# Patient Record
Sex: Male | Born: 2000 | Race: White | Hispanic: No | Marital: Single | Smoking: Never smoker
Health system: Southern US, Community
[De-identification: ages and names within clinical notes are randomized; demographics above are authoritative.]

## PROBLEM LIST (undated history)

## (undated) DIAGNOSIS — F419 Anxiety disorder, unspecified: Secondary | ICD-10-CM

## (undated) DIAGNOSIS — F909 Attention-deficit hyperactivity disorder, unspecified type: Secondary | ICD-10-CM

---
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
## 2019-03-25 IMAGING — MR MR HEAD WO/W CM
12 series · 48 of 48 positions shown · IV contrast (multihance)
Comparison: MRI head 03/30/2016, 09/14/2015

CLINICAL DATA: Follow-up cerebrovascular malformation

EXAM:
MRI HEAD WITHOUT AND WITH CONTRAST
TECHNIQUE: Multiplanar, multiecho pulse sequences of the brain and surrounding
structures were obtained without and with intravenous contrast.
CONTRAST:  10mL MULTIHANCE GADOBENATE DIMEGLUMINE 529 MG/ML IV SOLN

[Series 5: T1 · sagittal · 4.0mm · 0.75mm/px · 2 of 31 slices shown (1 of 3)]
[im 1/31]
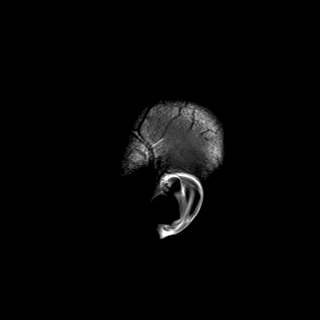
[im 31/31]
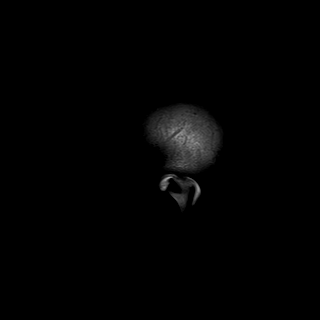

[Series 6: DWI · axial · 3.0mm · 1.44mm/px · z∈[-78,+68]mm · 5 of 92 slices shown (1 of 4)]
[im 1/92]
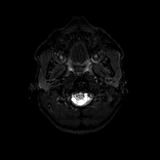
[im 23/92]
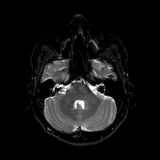
[im 46/92]
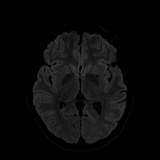
[im 69/92]
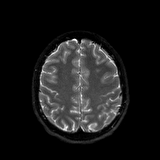
[im 92/92]
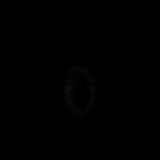

[Series 7: DWI · axial · 3.0mm · 1.44mm/px · z∈[-78,+68]mm · 3 of 43 slices shown (2 of 4)]
[im 1/43]
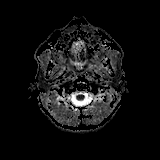
[im 22/43]
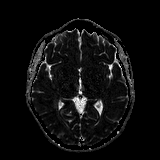
[im 43/43]
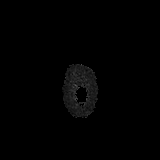

[Series 8: DWI · coronal · 5.0mm · 1.44mm/px · 4 of 64 slices shown (3 of 4)]
[im 1/64]
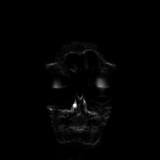
[im 22/64]
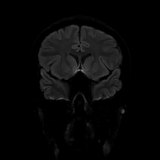
[im 43/64]
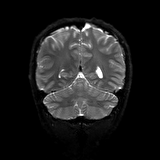
[im 64/64]
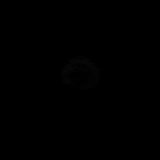

[Series 9: DWI · coronal · 5.0mm · 1.44mm/px · 2 of 33 slices shown (4 of 4)]
[im 1/33]
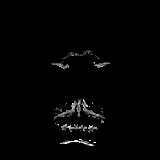
[im 33/33]
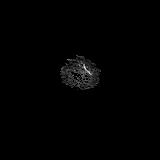

[Series 10: T2 · axial · 4.0mm · 0.36mm/px · z∈[-78,+70]mm · 2 of 30 slices shown]
[im 1/30]
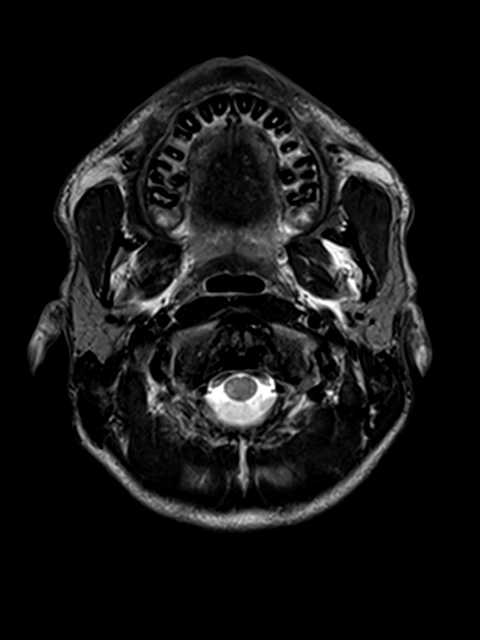
[im 30/30]
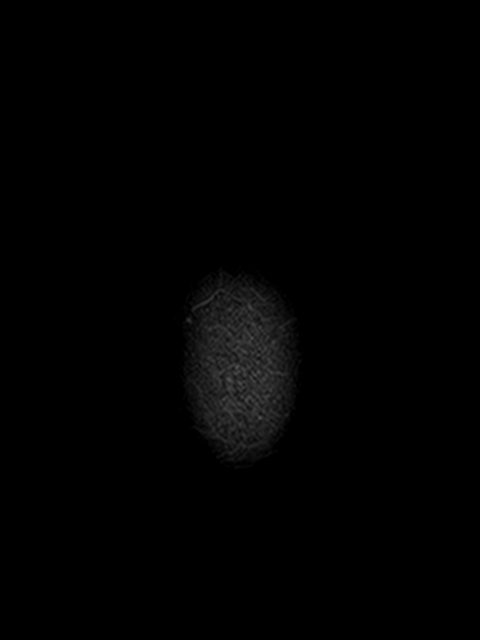

[Series 11: FLAIR · axial · 3.0mm · 0.72mm/px · z∈[-84,+75]mm · 2 of 28 slices shown]
[im 1/28]
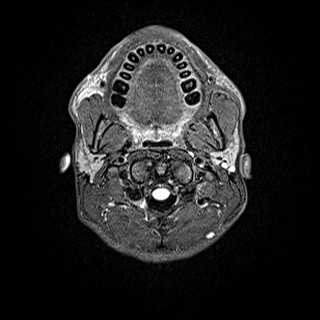
[im 28/28]
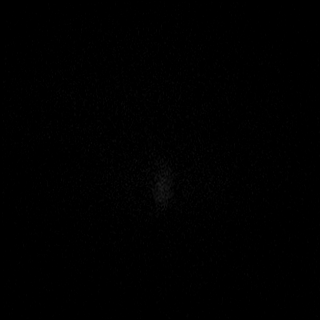

[Series 13: swi_images · axial · 1.5mm · 0.90mm/px · z∈[-80,+72]mm · 6 of 104 slices shown]
[im 1/104]
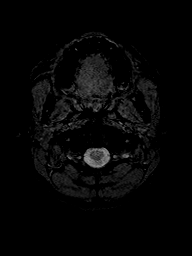
[im 21/104]
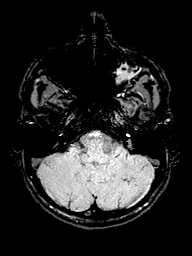
[im 42/104]
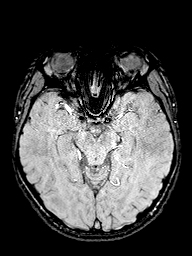
[im 62/104]
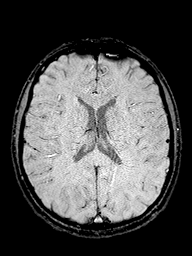
[im 83/104]
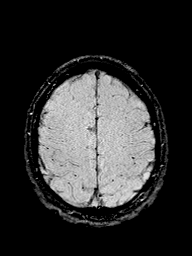
[im 104/104]
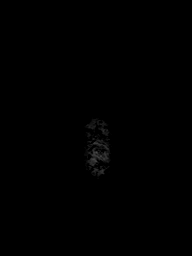

[Series 14: T1 · axial · 1.0mm · 0.90mm/px · z∈[-82,+74]mm · 9 of 160 slices shown (2 of 3)]
[im 1/160]
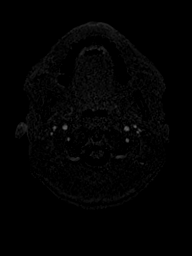
[im 20/160]
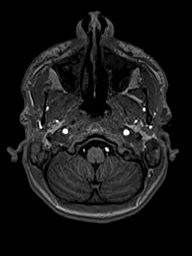
[im 40/160]
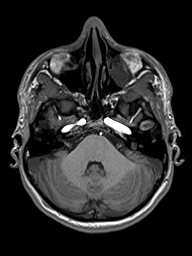
[im 60/160]
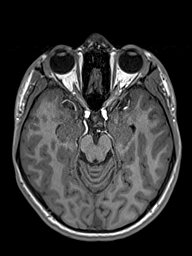
[im 80/160]
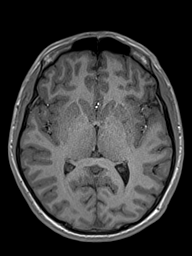
[im 100/160]
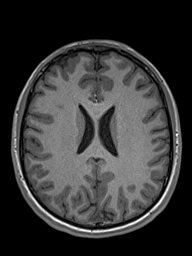
[im 120/160]
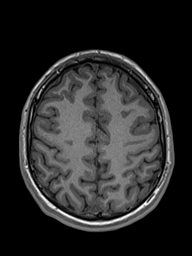
[im 140/160]
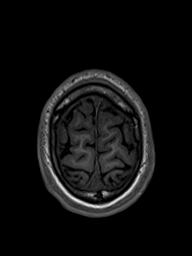
[im 160/160]
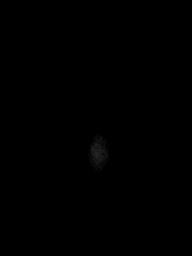

[Series 15: T2 post-contrast · coronal · 4.0mm · 0.36mm/px · 2 of 35 slices shown]
[im 1/35]
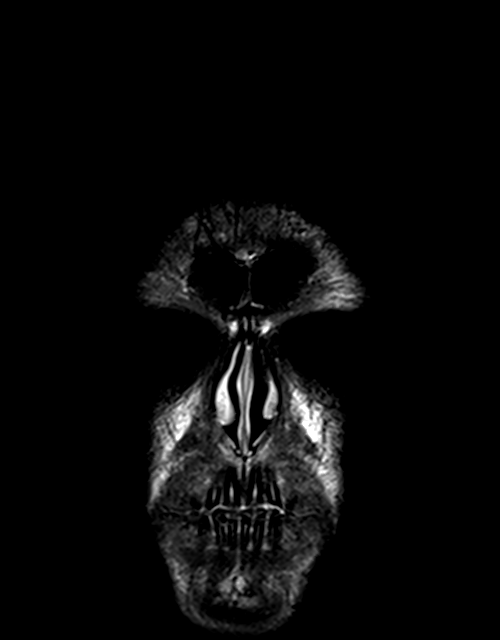
[im 35/35]
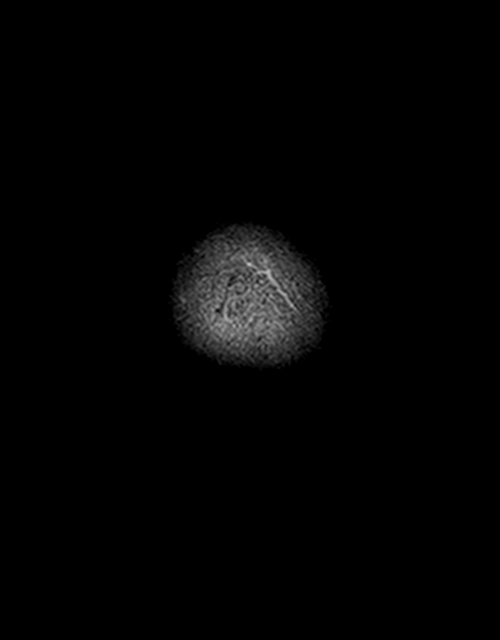

[Series 16: T1 · axial · 1.0mm · 0.90mm/px · z∈[-82,+74]mm · 9 of 160 slices shown (3 of 3)]
[im 1/160]
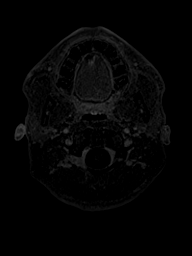
[im 20/160]
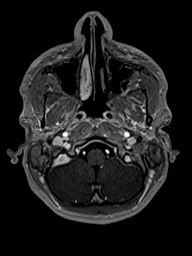
[im 40/160]
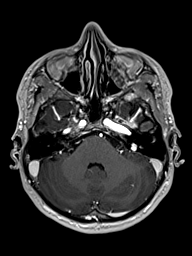
[im 60/160]
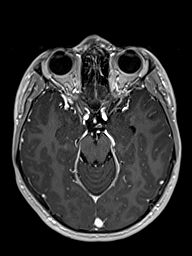
[im 80/160]
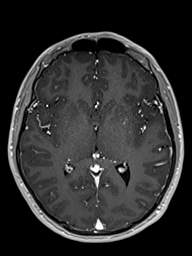
[im 100/160]
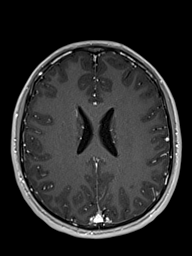
[im 120/160]
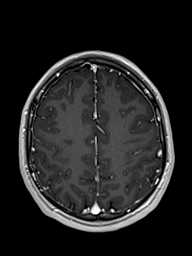
[im 140/160]
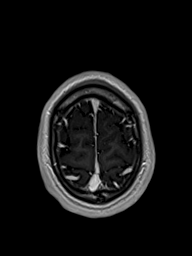
[im 160/160]
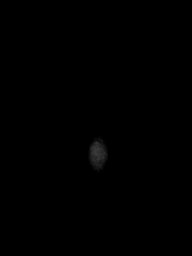

[Series 17: T1 post-contrast · coronal · 4.0mm · 0.72mm/px · 2 of 35 slices shown]
[im 1/35]
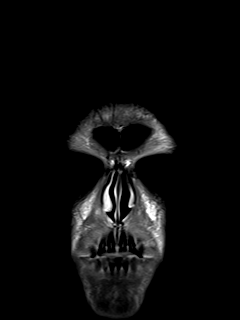
[im 35/35]
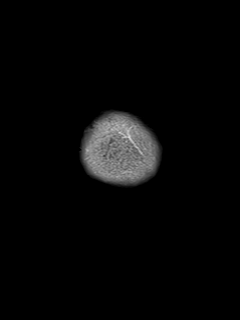

[48 of 48 positions shown; findings below may reference images not displayed]

FINDINGS: Brain: Ventricle size normal. Cerebral volume normal. Negative for
acute or chronic infarct. Negative for demyelinating disease. No
mass lesion.

Punctate area of chronic hemorrhage in the left putamen is
unchanged. Following contrast infusion, small associated enhancing
vessel is present in the vicinity of the hemorrhage unchanged from
prior studies.

Developmental venous anomaly left cerebellum unchanged without
evidence of hemorrhage.

Vascular: Normal arterial flow voids. Normal dural sinus venous
enhancement.

Skull and upper cervical spine: Negative

Sinuses/Orbits: Moderate mucosal edema throughout the paranasal
sinuses. Normal orbit

Other: None
IMPRESSION: No change from prior MRI examinations

Punctate chronic hemorrhage left putamen with associated small
vascular malformation most consistent with cavernoma and
developmental venous anomaly. Small developmental venous anomaly
left cerebellum without hemorrhage also unchanged.
